# Patient Record
Sex: Female | Born: 2000 | Race: Black or African American | Hispanic: No | Marital: Single | Smoking: Current every day smoker
Health system: Southern US, Community
[De-identification: ages and names within clinical notes are randomized; demographics above are authoritative.]

---
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
## 2021-05-05 ENCOUNTER — Ambulatory Visit: Payer: Self-pay | Admitting: Family Medicine

## 2021-05-05 DIAGNOSIS — Z113 Encounter for screening for infections with a predominantly sexual mode of transmission: Secondary | ICD-10-CM

## 2021-05-05 DIAGNOSIS — B3731 Acute candidiasis of vulva and vagina: Secondary | ICD-10-CM

## 2021-05-05 LAB — HM HEPATITIS C SCREENING LAB: HM Hepatitis Screen: NEGATIVE

## 2021-05-05 LAB — WET PREP FOR TRICH, YEAST, CLUE: Trichomonas Exam: NEGATIVE

## 2021-05-05 LAB — HEPATITIS B SURFACE ANTIGEN

## 2021-05-05 LAB — HM HIV SCREENING LAB: HM HIV Screening: NEGATIVE

## 2021-05-05 MED ORDER — CLOTRIMAZOLE 1 % VA CREA: 1.0000 | TOPICAL_CREAM | Freq: Every day | VAGINAL | 0 refills | Status: AC

## 2021-05-05 NOTE — Progress Notes (Signed)
[facility] Department ? ?STI clinic/screening visit ?[place] [address] ?[phone] ? ?Subjective:  ?Patricia Oneal is a 21 y.o. female being seen today for an STI screening visit. The patient reports they do not have symptoms.  Patient reports that they do not desire a pregnancy in the next year.   They reported they are not interested in discussing contraception today.   ? ?No LMP recorded. Patient has had an implant. ? ? ?Patient has the following medical conditions:   ?Patient Active Problem List  ? Diagnosis Date Noted  ? 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 ? ?Chief Complaint  ?Patient presents with  ? SEXUALLY TRANSMITTED DISEASE  ?  Screening  ? ? ?HPI ? ?Patient reports here for screening, reports swollen labia , discharge and itching  ? ?Last HIV test per patient/review of record was 11/22/2017 ?Patient reports no previous pap-disussed importance of pap.  ? ?Screening for MPX risk: ?Does the patient have an unexplained rash? No ?Is the patient MSM? No ?Does the patient endorse multiple sex partners or anonymous sex partners? Yes ?Did the patient have close or sexual contact with a person diagnosed with MPX? No ?Has the patient traveled outside the [place] where MPX is endemic? No ?Is there a high clinical suspicion for MPX-- evidenced by one of the following No ? -Unlikely to be chickenpox ? -Lymphadenopathy ? -Rash that present in same phase of evolution on any given body part ?See flowsheet for further details and programmatic requirements.  ? ? ?The following portions of the patient's history were reviewed and updated as appropriate: allergies, current medications, past medical history, past social history, past surgical history and problem list. ? ?Objective:  ?There were no vitals filed for this visit. ? ?Physical Exam ?Vitals and nursing note reviewed.  ?Constitutional:   ?   Appearance: Normal appearance.  ?HENT:  ?   Head: Normocephalic and atraumatic.  ?   Mouth/Throat:   ?   Mouth: Mucous membranes are moist.  ?   Pharynx: Oropharynx is clear. No oropharyngeal exudate or posterior oropharyngeal erythema.  ?Pulmonary:  ?   Effort: Pulmonary effort is normal.  ?Abdominal:  ?   General: Abdomen is flat.  ?   Palpations: There is no mass.  ?   Tenderness: There is no abdominal tenderness. There is no rebound.  ?Genitourinary: ?   General: Normal vulva.  ?   Exam position: Lithotomy position.  ?   Pubic Area: No rash or pubic lice.   ?   Labia:     ?   Right: No rash or lesion.     ?   Left: No rash or lesion.   ?   Vagina: Normal. No vaginal discharge, erythema, bleeding or lesions.  ?   Cervix: No cervical motion tenderness, discharge, friability, lesion or erythema.  ?   Uterus: Normal.   ?   Adnexa: Right adnexa normal and left adnexa normal.  ?   Rectum: Normal.  ?   Comments: External genitalia without, lice, nits, erythema, edema , lesions or inguinal adenopathy. Vagina with normal mucosa and curdy discharge and pH equals 4.  Cervix without visual lesions, uterus firm, mobile, non-tender, no masses, CMT adnexal fullness or tenderness.  ? ?Lymphadenopathy:  ?   Head:  ?   Right side of head: No preauricular or posterior auricular adenopathy.  ?   Left side of head: No preauricular or posterior auricular adenopathy.  ?   Cervical:  No cervical adenopathy.  ?   Upper Body:  ?   Right upper body: No supraclavicular or axillary adenopathy.  ?   Left upper body: No supraclavicular or axillary adenopathy.  ?   Lower Body: No right inguinal adenopathy. No left inguinal adenopathy.  ?Skin: ?   General: Skin is warm and dry.  ?   Findings: No rash.  ?Neurological:  ?   Mental Status: She is alert and oriented to person, place, and time.  ? ? ? ?Assessment and Plan:  ?Patricia Oneal is a 21 y.o. female presenting to the [facility] Department for STI screening ? ?1. Screening examination for venereal disease ?Patient accepted all screenings including wetprep, oral, vaginal CT/GC  and bloodwork for HIV/RPR.  ?Patient meets criteria for HepB screening? Yes. Ordered? Yes ?Patient meets criteria for HepC screening? Yes. Ordered? Yes ? ?Wet prep results +yeast    ?Treatment needed  ?Discussed time line for State Lab results and that patient will be called with positive results and encouraged patient to call if she had not heard in 2 weeks.  ?Counseled to return or seek care for continued or worsening symptoms ?Recommended condom use with all sex ? ?Patient is currently using *Nexplanon to prevent pregnancy.   ?- Chlamydia/Gonorrhea [place] [facility] ?- Syphilis Serology, [place] [facility] ?- HIV/HCV [place] [facility] ?- WET PREP FOR [place], YEAST, CLUE ?- HBV Antigen/Antibody [facility] ?- Chlamydia/Gonorrhea  Lab ? ?2. Yeast vaginitis ? ?- clotrimazole (V-R CLOTRIMAZOLE VAGINAL) 1 % vaginal cream; Place 1 Applicatorful vaginally at bedtime for 7 days.  Dispense: 45 g; Refill: 0 ? ? ? ? ?Return for as needed. ? ?No future appointments. ? ?Junious Dresser, FNP ? ?

## 2021-05-05 NOTE — Patient Instructions (Signed)
Steps to prevent BV and yeast: Wear all-cotton underwear Sleep without underwear Take showers instead of baths Wear loose fitting clothing, especially during warm/hot weather Use a hair dryer on low after bathing to dry the area Avoid scented soaps and body washes Do not douche May try over the counter probiotics or boric acid gel or suppositories Stop smoking  

## 2021-05-05 NOTE — Progress Notes (Signed)
Pt here for STD screening.  Wet mount results reviewed and medication dispensed, per Provider orders.  Condoms given.  Windle Guard, RN ? ?

## 2021-05-30 ENCOUNTER — Ambulatory Visit: Payer: Self-pay | Admitting: Advanced Practice Midwife

## 2021-05-30 ENCOUNTER — Encounter: Payer: Self-pay | Admitting: Advanced Practice Midwife

## 2021-05-30 DIAGNOSIS — F1010 Alcohol abuse, uncomplicated: Secondary | ICD-10-CM

## 2021-05-30 DIAGNOSIS — F17290 Nicotine dependence, other tobacco product, uncomplicated: Secondary | ICD-10-CM

## 2021-05-30 DIAGNOSIS — Z113 Encounter for screening for infections with a predominantly sexual mode of transmission: Secondary | ICD-10-CM

## 2021-05-30 LAB — WET PREP FOR TRICH, YEAST, CLUE
Trichomonas Exam: NEGATIVE
Yeast Exam: NEGATIVE

## 2021-05-30 NOTE — Progress Notes (Signed)
Here for STD screening. Would like to be scheduled for PE and Nexplanon removal/reinsertion, will need Pap test..Patricia Durrett Wynelle Beckmann, RN  ?

## 2021-05-30 NOTE — Progress Notes (Signed)
[facility] Department ? ?STI clinic/screening visit ?[place] [address] ?[phone] ? ?Subjective:  ?Patricia Oneal is a 21 y.o. SBF nullip smoker female being seen today for an STI screening visit. The patient reports they do not have symptoms.  Patient reports that they do not desire a pregnancy in the next year.   They reported they are not interested in discussing contraception today.   ? ?No LMP recorded (lmp unknown). Patient has had an implant. ? ? ?Patient has the following medical conditions:   ?Patient Active Problem List  ? Diagnosis Date Noted  ? 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 ? ?Chief Complaint  ?Patient presents with  ? SEXUALLY TRANSMITTED DISEASE  ? ? ?HPI ? ?Patient reports asymptomatic. Nexplanon inserted 2019. Last sex 05/28/21 without condom; with current partner x 1 week; 3 sex partners in last 3 mo last cigar yesterday. LMP 04/19/21. Last ETOH last night (9 shots [employer]) qo day. Last MJ yesterday. Incarcerated x2 years and recently got out.  ? ?Last HIV test per patient/review of record was 05/05/21 ?Patient reports last pap was never ? ?Screening for MPX risk: ?Does the patient have an unexplained rash? No ?Is the patient MSM? No ?Does the patient endorse multiple sex partners or anonymous sex partners? Yes ?Did the patient have close or sexual contact with a person diagnosed with MPX? No ?Has the patient traveled outside the [place] where MPX is endemic? No ?Is there a high clinical suspicion for MPX-- evidenced by one of the following No ? -Unlikely to be chickenpox ? -Lymphadenopathy ? -Rash that present in same phase of evolution on any given body part ?See flowsheet for further details and programmatic requirements.  ? ? ?The following portions of the patient's history were reviewed and updated as appropriate: allergies, current medications, past medical history, past social history, past surgical history and problem list. ? ?Objective:  ?There were no  vitals filed for this visit. ? ?Physical Exam ?Vitals and nursing note reviewed.  ?Constitutional:   ?   Appearance: Normal appearance.  ?HENT:  ?   Head: Normocephalic and atraumatic.  ?   Mouth/Throat:  ?   Mouth: Mucous membranes are moist.  ?   Pharynx: Oropharynx is clear. No oropharyngeal exudate or posterior oropharyngeal erythema.  ?Eyes:  ?   Conjunctiva/sclera: Conjunctivae normal.  ?Pulmonary:  ?   Effort: Pulmonary effort is normal.  ?Abdominal:  ?   Palpations: Abdomen is soft. There is no mass.  ?   Tenderness: There is no abdominal tenderness. There is no rebound.  ?   Comments: Soft without masses or tenderness, good tone  ?Genitourinary: ?   General: Normal vulva.  ?   Exam position: Lithotomy position.  ?   Pubic Area: No rash or pubic lice.   ?   Labia:     ?   Right: No rash or lesion.     ?   Left: No rash or lesion.   ?   Vagina: Vaginal discharge (small amt white creamy leukorrhea, ph<4.5) present. No erythema, bleeding or lesions.  ?   Cervix: Normal.  ?   Uterus: Normal.   ?   Adnexa: Right adnexa normal and left adnexa normal.  ?   Rectum: Normal.  ?Lymphadenopathy:  ?   Head:  ?   Right side of head: No preauricular or posterior auricular adenopathy.  ?   Left side of head: No preauricular or posterior auricular adenopathy.  ?  Cervical: No cervical adenopathy.  ?   Upper Body:  ?   Right upper body: No supraclavicular or axillary adenopathy.  ?   Left upper body: No supraclavicular or axillary adenopathy.  ?   Lower Body: No right inguinal adenopathy. No left inguinal adenopathy.  ?Skin: ?   General: Skin is warm and dry.  ?   Findings: No rash.  ?Neurological:  ?   Mental Status: She is alert and oriented to person, place, and time.  ? ? ? ?Assessment and Plan:  ?Patricia Oneal is a 21 y.o. female presenting to the [facility] Department for STI screening ? ?1. Screening examination for venereal disease ?Treat wet mount per standing orders ?Immunization nurse consult ?Needs  apt for PE, pap, Nexplanon removal--please assist pt ?Pt needs physical and pap and Nexplanon removal--please assist pt with scheduling ?- WET PREP FOR TRICH, YEAST, CLUE ?- Chlamydia/Gonorrhea Speed Lab ?- Gonococcus culture ? ? ? ? ?No follow-ups on file. ? ?No future appointments. ? ?Herbie Saxon, CNM ? ?

## 2021-05-30 NOTE — Progress Notes (Signed)
Wet prep reviewed, no treatment indicated. Patient scheduled for PE, Pap and nex. Removal/reinsertion.Jenetta Downer, RN  ?

## 2021-06-04 LAB — GONOCOCCUS CULTURE

## 2021-06-15 ENCOUNTER — Ambulatory Visit: Payer: Self-pay

## 2021-06-23 ENCOUNTER — Ambulatory Visit: Payer: Self-pay

## 2021-07-06 ENCOUNTER — Ambulatory Visit: Payer: Self-pay

## 2021-07-12 ENCOUNTER — Ambulatory Visit: Payer: Self-pay

## 2021-08-05 ENCOUNTER — Other Ambulatory Visit: Payer: Self-pay

## 2021-08-05 DIAGNOSIS — L03011 Cellulitis of right finger: Secondary | ICD-10-CM | POA: Diagnosis not present

## 2021-08-05 DIAGNOSIS — M7989 Other specified soft tissue disorders: Secondary | ICD-10-CM | POA: Diagnosis not present

## 2021-08-05 DIAGNOSIS — M79644 Pain in right finger(s): Secondary | ICD-10-CM | POA: Insufficient documentation

## 2021-08-05 NOTE — ED Triage Notes (Addendum)
Pt states her acrylic nail came off and tore part of her nail with it 4 days ago. Affected nail is right pinkie, nail torn off diagonally, no bleeding or open wound noted. No redness or discharge noted, mild swelling noted. Pt is AOX4, NAD noted. Pt states she's been sleeping with hand elevated due to pain- reports she has not tried OTC medication.

## 2021-08-06 ENCOUNTER — Emergency Department
Admission: EM | Admit: 2021-08-06 | Discharge: 2021-08-06 | Disposition: A | Discharge status: Home / Self Care | Payer: BC Managed Care – PPO | Attending: Emergency Medicine | Admitting: Emergency Medicine

## 2021-08-06 ENCOUNTER — Other Ambulatory Visit: Payer: Self-pay

## 2021-08-06 DIAGNOSIS — M79644 Pain in right finger(s): Secondary | ICD-10-CM

## 2021-08-06 DIAGNOSIS — L03011 Cellulitis of right finger: Secondary | ICD-10-CM | POA: Insufficient documentation

## 2021-08-06 MED ORDER — CEFADROXIL 500 MG PO CAPS: 500.0000 mg | ORAL_CAPSULE | Freq: Two times a day (BID) | ORAL | 0 refills | Status: AC

## 2021-08-06 MED ORDER — IBUPROFEN 600 MG PO TABS
600.0000 mg | ORAL_TABLET | Freq: Once | ORAL | Status: AC
  Administered 2021-08-06: 600 mg via ORAL
  Filled 2021-08-06: qty 1

## 2021-08-06 MED ORDER — DOXYCYCLINE HYCLATE 100 MG PO CAPS: 100.0000 mg | ORAL_CAPSULE | Freq: Two times a day (BID) | ORAL | 0 refills | Status: AC

## 2021-08-06 MED ORDER — ACETAMINOPHEN 500 MG PO TABS
1000.0000 mg | ORAL_TABLET | Freq: Once | ORAL | Status: AC
  Administered 2021-08-06: 1000 mg via ORAL
  Filled 2021-08-06: qty 2

## 2021-08-06 NOTE — ED Triage Notes (Signed)
FIRST NURSE NOTE:  pt here with same complaint as last night with fingernail, pt asking if it can be taken off due to pain. Advised pt that I cannot guarantee that someone who take it off.

## 2021-08-06 NOTE — ED Notes (Signed)
Pt asleep in flex wait in no acute distress.

## 2021-08-06 NOTE — ED Provider Notes (Signed)
[facility] Provider Note    Event Date/Time   First MD Initiated Contact with Patient 08/06/21 0245     (approximate)   History   Nail Problem   HPI  Patricia Oneal is a 21 y.o. female with no contributory past medical history who presents for evaluation of right little finger pain.  She states that her acrylic nail got ripped off about 4 days ago and it took off part of her nail with her.  She says she was fine until yesterday and then last night her finger started hurting around the nailbed.  She said that it is a severe sharp stabbing pain.  There is no swelling or redness.  She did not sustain any other injuries.  She has normal mobility of the finger.  She does not bite on her fingernails.     Physical Exam   Triage Vital Signs: ED Triage Vitals  Enc Vitals Group     BP 08/05/21 2125 98/83     Pulse Rate 08/05/21 2121 93     Resp 08/05/21 2121 18     Temp 08/05/21 2121 98 F (36.7 C)     Temp Source 08/05/21 2121 Oral     SpO2 08/05/21 2121 97 %     Weight --      Height --      Head Circumference --      Peak Flow --      Pain Score 08/05/21 2123 9     Pain Loc --      Pain Edu? --      Excl. in [place]? --     Most recent vital signs: Vitals:   08/05/21 2121 08/05/21 2125  BP:  98/83  Pulse: 93   Resp: 18   Temp: 98 F (36.7 C)   SpO2: 97%      General: Awake, no distress.  Lying on the stretcher with a blanket pulled up over her head. CV:  Good peripheral perfusion.  Normal capillary refill. Resp:  Normal effort.  Speaking in full sentences. Abd:  No distention.  Other:  Patient has acrylic nails on all of her fingers except for the right little finger which is causing her discomfort.  The distal 1/3 to 1/2 of the nail is gone.  No bleeding or evidence of bleeding.  No swelling, no erythema, normal range of motion, no gross deformity.  She may have a slight bit of swelling right at the base of the nail along the cuticle, but it is  very difficult to appreciate.  There is no drainable fluid collection.   ED Results / Procedures / Treatments    MEDICATIONS ORDERED IN ED: Medications  ibuprofen (ADVIL) tablet 600 mg (has no administration in time range)  acetaminophen (TYLENOL) tablet 1,000 mg (has no administration in time range)     IMPRESSION / MDM / ASSESSMENT AND PLAN / ED COURSE  I reviewed the triage vital signs and the nursing notes.                              Differential diagnosis includes, but is not limited to, fracture, dislocation, paronychia, cellulitis, compartment syndrome.  Patient's presentation is most consistent with acute, uncomplicated illness.  No sign of fracture or dislocation.  Finger is essentially normal in appearance except for the missing acrylic nail and part of the distal natural nail.  The most likely diagnosis is  a very early developing paronychia but without any drainable fluid collection at this time.  I also mentioned that if there is a paronychia, or if it develops into line, it would need drainage, and she immediately pulled away and said that no one was going to cutting on her finger.  I provided reassurance that I do not need to do so tonight but I recommended that we start some antibiotics out of an abundance of caution given the high degree of pain she is reporting without any obvious source.  There is no role for imaging at this time.  She has not tried any over-the-counter medication at home so encouraged she take over-the-counter ibuprofen and/or Tylenol provided a dosing schedule for her.  I provided information about how she can follow-up as an outpatient and I provided my usual and customary return precautions.         FINAL CLINICAL IMPRESSION(S) / ED DIAGNOSES   Final diagnoses:  Finger pain, right     Rx / DC Orders   ED Discharge Orders          Ordered    doxycycline (VIBRAMYCIN) 100 MG capsule  2 times daily        08/06/21 0348    cefadroxil  (DURICEF) 500 MG capsule  2 times daily        08/06/21 0348             Note:  This document was prepared using Dragon voice recognition software and may include unintentional dictation errors.   Hinda Kehr, MD 08/06/21 [phone]

## 2021-08-06 NOTE — ED Triage Notes (Addendum)
PT arrives tonight with R pinky swelling and pain. Pt R pinky nail has swelling and blister under and around nail bed. Pt stating she was here last night and the doctor wouldn't remove it, but pt stating it hurts worse. Pt sitting in triage NAD noted.  Pt stating that they were given PO abx last night  in the ER and a rx, but did not get the rx from pharmacy

## 2021-08-06 NOTE — ED Notes (Signed)
Pt to restroom, warm blankets provided

## 2021-08-06 NOTE — Discharge Instructions (Signed)
As we discussed, you may be developing a mild infection around the nail on your right little finger.  Please take the full course of antibiotics as prescribed.  Read through the included information, and try soaking your finger in warm water with Epsom salts dissolved in it (Epson salts can be bought over-the-counter at any pharmacy).  Follow up with [facility] and/or with Dr. Mack Guise or one of his colleagues at the orthopedics clinic.  Take over-the-counter ibuprofen 600 mg three times daily with meals, and also take Tylenol 1000 mg every six hours.  These medications will help with the pain.

## 2021-08-07 ENCOUNTER — Emergency Department
Admission: EM | Admit: 2021-08-07 | Discharge: 2021-08-07 | Disposition: A | Discharge status: Home / Self Care | Payer: BC Managed Care – PPO | Source: Home / Self Care | Attending: Emergency Medicine | Admitting: Emergency Medicine

## 2021-08-07 DIAGNOSIS — L03011 Cellulitis of right finger: Secondary | ICD-10-CM

## 2021-08-07 MED ORDER — ACETAMINOPHEN 500 MG PO TABS
1000.0000 mg | ORAL_TABLET | Freq: Once | ORAL | Status: AC
  Administered 2021-08-07: 1000 mg via ORAL
  Filled 2021-08-07: qty 2

## 2021-08-07 MED ORDER — LIDOCAINE HCL (PF) 1 % IJ SOLN
5.0000 mL | Freq: Once | INTRAMUSCULAR | Status: AC
  Administered 2021-08-07: 5 mL via INTRADERMAL
  Filled 2021-08-07: qty 5

## 2021-08-07 MED ORDER — IBUPROFEN 400 MG PO TABS
400.0000 mg | ORAL_TABLET | Freq: Once | ORAL | Status: AC
  Administered 2021-08-07: 400 mg via ORAL
  Filled 2021-08-07: qty 1

## 2021-08-07 NOTE — ED Provider Notes (Signed)
[facility] Provider Note    Event Date/Time   First MD Initiated Contact with Patient 08/07/21 0101     (approximate)   History   Nail Problem   HPI  Patricia Oneal is a 21 y.o. female without significant past medical history who presents for evaluation of some pain and swelling at the tip of her right fifth digit.  She states an acrylic nail got ripped off about 4 days prior.  She was seen emergency room yesterday where she was discharged with antibiotics with concern for possible very early paronychia to ability to drain.  She is returning today due to ongoing swelling and pain requesting her nail be removed.  She has no other acute symptoms including pain spreading down the digit or any numbness or weakness in the hand or any other areas of acute pain redness or swelling.  She states she has been taking her antibiotics.    No past medical history on file.   Physical Exam  Triage Vital Signs: ED Triage Vitals [08/06/21 2253]  Enc Vitals Group     BP 111/78     Pulse Rate 75     Resp 12     Temp 98.6 F (37 C)     Temp src      SpO2 98 %     Weight 143 lb (64.9 kg)     Height      Head Circumference      Peak Flow      Pain Score 9     Pain Loc      Pain Edu?      Excl. in [place]?     Most recent vital signs: Vitals:   08/06/21 2253  BP: 111/78  Pulse: 75  Resp: 12  Temp: 98.6 F (37 C)  SpO2: 98%    General: Awake, no distress.  CV:  Good peripheral perfusion.  Resp:  Normal effort.  Abd:  No distention.  Other:  There is appears to be a little bit of purulence under the radial aspect of the right nail.  The nail itself is otherwise unremarkable.  No significant swelling or edema of the tuft of the finger.  She is able to flex and extend at the distal interphalangeal joint.  Finger is otherwise unremarkable.   ED Results / Procedures / Treatments  Labs (all labs ordered are listed, but only abnormal results are displayed) Labs  Reviewed - No data to display   EKG    RADIOLOGY    PROCEDURES:  Critical Care performed: No  ..Incision and Drainage  Date/Time: 08/07/2021 1:55 AM  Performed by: Lucrezia Starch, MD Authorized by: Lucrezia Starch, MD   Consent:    Consent obtained:  Verbal   Consent given by:  Patient   Risks discussed:  Bleeding and infection   Alternatives discussed:  No treatment Universal protocol:    Patient identity confirmed:  Verbally with patient Location:    Type:  Abscess   Size:  <1cm   Location:  Upper extremity   Upper extremity location:  Finger Pre-procedure details:    Skin preparation:  Antiseptic wash Sedation:    Sedation type:  None Anesthesia:    Anesthesia method:  Nerve block   Block anesthetic:  Lidocaine 1% w/o epi Procedure type:    Complexity:  Simple Procedure details:    Ultrasound guidance: no     Needle aspiration: no     Incision types:  Stab incision   Drainage:  Purulent   Drainage amount:  Scant   Wound treatment:  Wound left open   Packing materials:  None Post-procedure details:    Procedure completion:  Tolerated well, no immediate complications    MEDICATIONS ORDERED IN ED: Medications  acetaminophen (TYLENOL) tablet 1,000 mg (has no administration in time range)  ibuprofen (ADVIL) tablet 400 mg (has no administration in time range)  lidocaine (PF) (XYLOCAINE) 1 % injection 5 mL (has no administration in time range)     IMPRESSION / MDM / ASSESSMENT AND PLAN / ED COURSE  I reviewed the triage vital signs and the nursing notes. Patient's presentation is most consistent with acute presentation with potential threat to life or bodily function.                               Differential diagnosis includes, but is not limited to cellulitis with possible abscess i.e. paronychia on the lateral aspect of the nail.  Nail itself has no evidence of subungual hematoma and there is no significant bone of the tuft to suggest a felon at  this time.  She is able to flex extend her digit and is not held in flexion or significantly painful on passive extension and there is no circumferential swelling to suggest tenosynovitis at this time.  I&D performed per procedure note above without any difficulty.  Advised patient to keep wound clean and that she can wear a Band-Aid tonight but should keep it off during tomorrow morning and to continue her antibiotic.  Advised her to follow-up with PCP for wound check in 5 days.  Discussed returning for any new or worsening symptoms.  Discharged in stable condition for strict turn precautions advised and discussed.        FINAL CLINICAL IMPRESSION(S) / ED DIAGNOSES   Final diagnoses:  Paronychia of finger of right hand     Rx / DC Orders   ED Discharge Orders     None        Note:  This document was prepared using Dragon voice recognition software and may include unintentional dictation errors.   Lucrezia Starch, MD 08/07/21 [phone]
